# Patient Record
Sex: Female | Born: 1975 | Race: White | Hispanic: No | Marital: Married | Smoking: Former smoker
Health system: Southern US, Community
[De-identification: ages and names within clinical notes are randomized; demographics above are authoritative.]

## PROBLEM LIST (undated history)

## (undated) DIAGNOSIS — Z8742 Personal history of other diseases of the female genital tract: Secondary | ICD-10-CM

## (undated) DIAGNOSIS — B379 Candidiasis, unspecified: Secondary | ICD-10-CM

## (undated) DIAGNOSIS — R102 Pelvic and perineal pain: Secondary | ICD-10-CM

## (undated) DIAGNOSIS — Z8619 Personal history of other infectious and parasitic diseases: Secondary | ICD-10-CM

## (undated) DIAGNOSIS — N923 Ovulation bleeding: Secondary | ICD-10-CM

## (undated) DIAGNOSIS — J189 Pneumonia, unspecified organism: Secondary | ICD-10-CM

## (undated) DIAGNOSIS — Z87448 Personal history of other diseases of urinary system: Secondary | ICD-10-CM

## (undated) DIAGNOSIS — IMO0002 Reserved for concepts with insufficient information to code with codable children: Secondary | ICD-10-CM

## (undated) DIAGNOSIS — N809 Endometriosis, unspecified: Secondary | ICD-10-CM

## (undated) DIAGNOSIS — G8929 Other chronic pain: Secondary | ICD-10-CM

## (undated) HISTORY — DX: Personal history of other diseases of urinary system: Z87.448

## (undated) HISTORY — DX: Personal history of other diseases of the female genital tract: Z87.42

## (undated) HISTORY — DX: Pneumonia, unspecified organism: J18.9

## (undated) HISTORY — DX: Ovulation bleeding: N92.3

## (undated) HISTORY — PX: DENTAL SURGERY: SHX609

## (undated) HISTORY — DX: Other chronic pain: G89.29

## (undated) HISTORY — DX: Reserved for concepts with insufficient information to code with codable children: IMO0002

## (undated) HISTORY — DX: Candidiasis, unspecified: B37.9

## (undated) HISTORY — DX: Endometriosis, unspecified: N80.9

## (undated) HISTORY — DX: Pelvic and perineal pain: R10.2

## (undated) HISTORY — DX: Personal history of other infectious and parasitic diseases: Z86.19

---

## 1998-08-19 ENCOUNTER — Emergency Department (HOSPITAL_COMMUNITY): Admission: EM | Admit: 1998-08-19 | Discharge: 1998-08-19 | Payer: Self-pay | Admitting: Emergency Medicine

## 2000-05-13 ENCOUNTER — Other Ambulatory Visit: Admission: RE | Admit: 2000-05-13 | Discharge: 2000-05-13 | Payer: Self-pay | Admitting: Family Medicine

## 2000-07-03 ENCOUNTER — Ambulatory Visit (HOSPITAL_COMMUNITY): Admission: RE | Admit: 2000-07-03 | Discharge: 2000-07-03 | Payer: Self-pay | Admitting: Family Medicine

## 2000-07-03 ENCOUNTER — Encounter: Payer: Self-pay | Admitting: Family Medicine

## 2000-11-05 ENCOUNTER — Inpatient Hospital Stay (HOSPITAL_COMMUNITY): Admission: AD | Admit: 2000-11-05 | Discharge: 2000-11-08 | Payer: Self-pay | Admitting: Family Medicine

## 2003-02-22 ENCOUNTER — Other Ambulatory Visit: Admission: RE | Admit: 2003-02-22 | Discharge: 2003-02-22 | Payer: Self-pay | Admitting: Obstetrics and Gynecology

## 2003-07-29 ENCOUNTER — Inpatient Hospital Stay (HOSPITAL_COMMUNITY): Admission: AD | Admit: 2003-07-29 | Discharge: 2003-07-29 | Payer: Self-pay | Admitting: Obstetrics and Gynecology

## 2003-08-03 ENCOUNTER — Inpatient Hospital Stay (HOSPITAL_COMMUNITY): Admission: AD | Admit: 2003-08-03 | Discharge: 2003-08-05 | Payer: Self-pay | Admitting: Obstetrics and Gynecology

## 2003-09-12 ENCOUNTER — Encounter (INDEPENDENT_AMBULATORY_CARE_PROVIDER_SITE_OTHER): Payer: Self-pay | Admitting: Specialist

## 2003-09-12 ENCOUNTER — Ambulatory Visit (HOSPITAL_COMMUNITY): Admission: RE | Admit: 2003-09-12 | Discharge: 2003-09-12 | Payer: Self-pay | Admitting: Obstetrics and Gynecology

## 2004-01-22 DIAGNOSIS — Z8742 Personal history of other diseases of the female genital tract: Secondary | ICD-10-CM

## 2004-01-22 HISTORY — DX: Personal history of other diseases of the female genital tract: Z87.42

## 2004-02-22 ENCOUNTER — Other Ambulatory Visit: Admission: RE | Admit: 2004-02-22 | Discharge: 2004-02-22 | Payer: Self-pay | Admitting: Obstetrics and Gynecology

## 2004-03-08 ENCOUNTER — Other Ambulatory Visit: Admission: RE | Admit: 2004-03-08 | Discharge: 2004-03-08 | Payer: Self-pay | Admitting: Obstetrics and Gynecology

## 2004-03-23 DIAGNOSIS — IMO0002 Reserved for concepts with insufficient information to code with codable children: Secondary | ICD-10-CM

## 2004-03-23 HISTORY — DX: Reserved for concepts with insufficient information to code with codable children: IMO0002

## 2004-04-12 ENCOUNTER — Other Ambulatory Visit: Admission: RE | Admit: 2004-04-12 | Discharge: 2004-04-12 | Payer: Self-pay | Admitting: Obstetrics and Gynecology

## 2005-01-21 DIAGNOSIS — IMO0002 Reserved for concepts with insufficient information to code with codable children: Secondary | ICD-10-CM

## 2005-01-21 DIAGNOSIS — N923 Ovulation bleeding: Secondary | ICD-10-CM

## 2005-01-21 HISTORY — DX: Ovulation bleeding: N92.3

## 2005-01-21 HISTORY — DX: Reserved for concepts with insufficient information to code with codable children: IMO0002

## 2005-04-01 ENCOUNTER — Other Ambulatory Visit: Admission: RE | Admit: 2005-04-01 | Discharge: 2005-04-01 | Payer: Self-pay | Admitting: Obstetrics and Gynecology

## 2006-01-21 DIAGNOSIS — Z8742 Personal history of other diseases of the female genital tract: Secondary | ICD-10-CM

## 2006-01-21 DIAGNOSIS — N809 Endometriosis, unspecified: Secondary | ICD-10-CM

## 2006-01-21 HISTORY — DX: Endometriosis, unspecified: N80.9

## 2006-01-21 HISTORY — DX: Personal history of other diseases of the female genital tract: Z87.42

## 2006-05-16 ENCOUNTER — Ambulatory Visit (HOSPITAL_COMMUNITY): Admission: RE | Admit: 2006-05-16 | Discharge: 2006-05-17 | Payer: Self-pay | Admitting: Obstetrics and Gynecology

## 2006-05-16 ENCOUNTER — Encounter (INDEPENDENT_AMBULATORY_CARE_PROVIDER_SITE_OTHER): Payer: Self-pay | Admitting: Specialist

## 2006-05-16 DIAGNOSIS — G8929 Other chronic pain: Secondary | ICD-10-CM

## 2006-05-16 HISTORY — PX: ABDOMINAL HYSTERECTOMY: SHX81

## 2006-05-16 HISTORY — DX: Other chronic pain: G89.29

## 2007-03-24 ENCOUNTER — Encounter (INDEPENDENT_AMBULATORY_CARE_PROVIDER_SITE_OTHER): Payer: Self-pay | Admitting: General Surgery

## 2007-03-24 ENCOUNTER — Observation Stay (HOSPITAL_COMMUNITY): Admission: EM | Admit: 2007-03-24 | Discharge: 2007-03-25 | Payer: Self-pay | Admitting: Emergency Medicine

## 2009-04-10 ENCOUNTER — Encounter: Admission: RE | Admit: 2009-04-10 | Discharge: 2009-04-10 | Payer: Self-pay | Admitting: Family Medicine

## 2009-07-13 IMAGING — CR DG CHEST 2V
2 series · 2 of 2 positions shown · non-contrast
Comparison: none

HISTORY: Chest pain, abdominal pain, vomiting

CHEST 2 VIEWS:
No prior study for comparison.
Normal heart size, mediastinal contours, and vascularity.
Mild bronchitic changes.
No infiltrate, effusion, or pneumothorax.
Bones unremarkable.

[w chest pa]
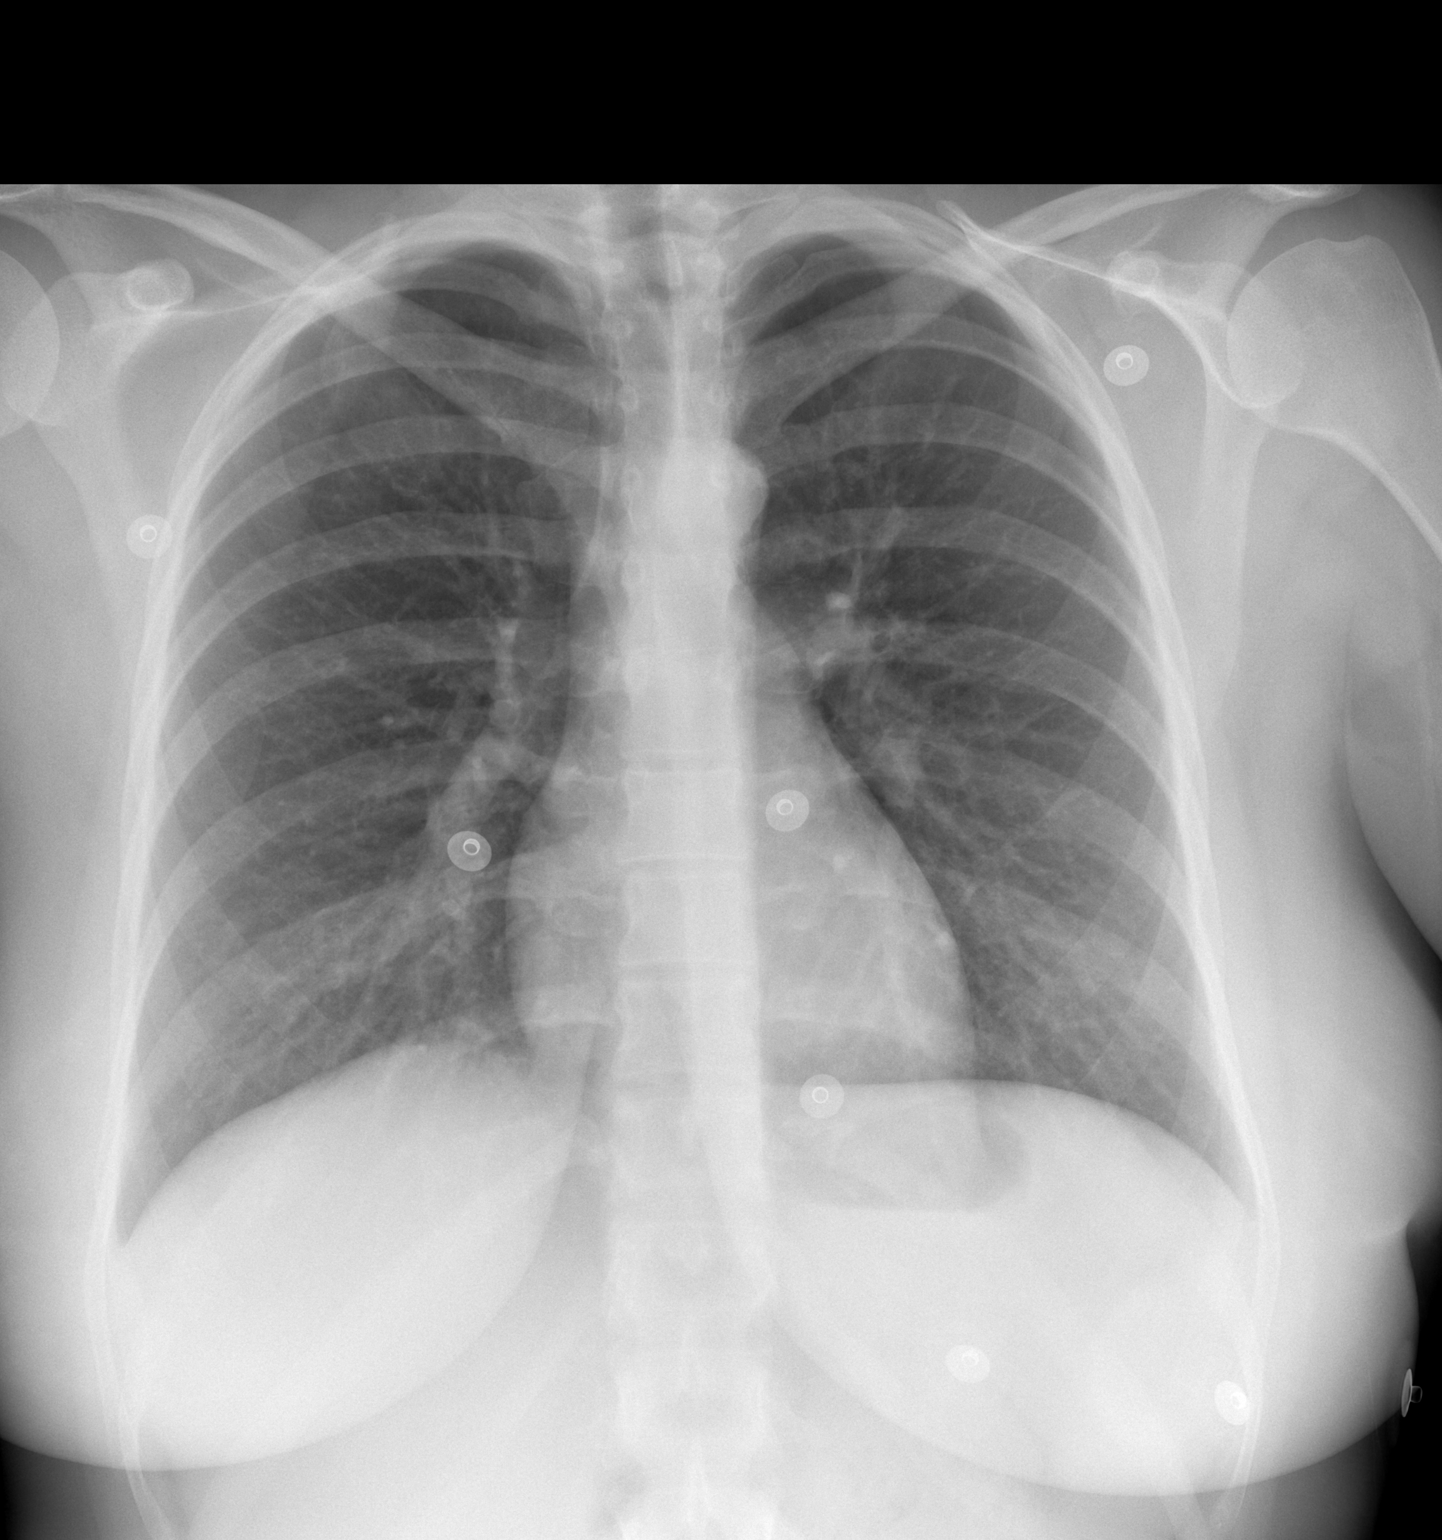

[w chest lat]
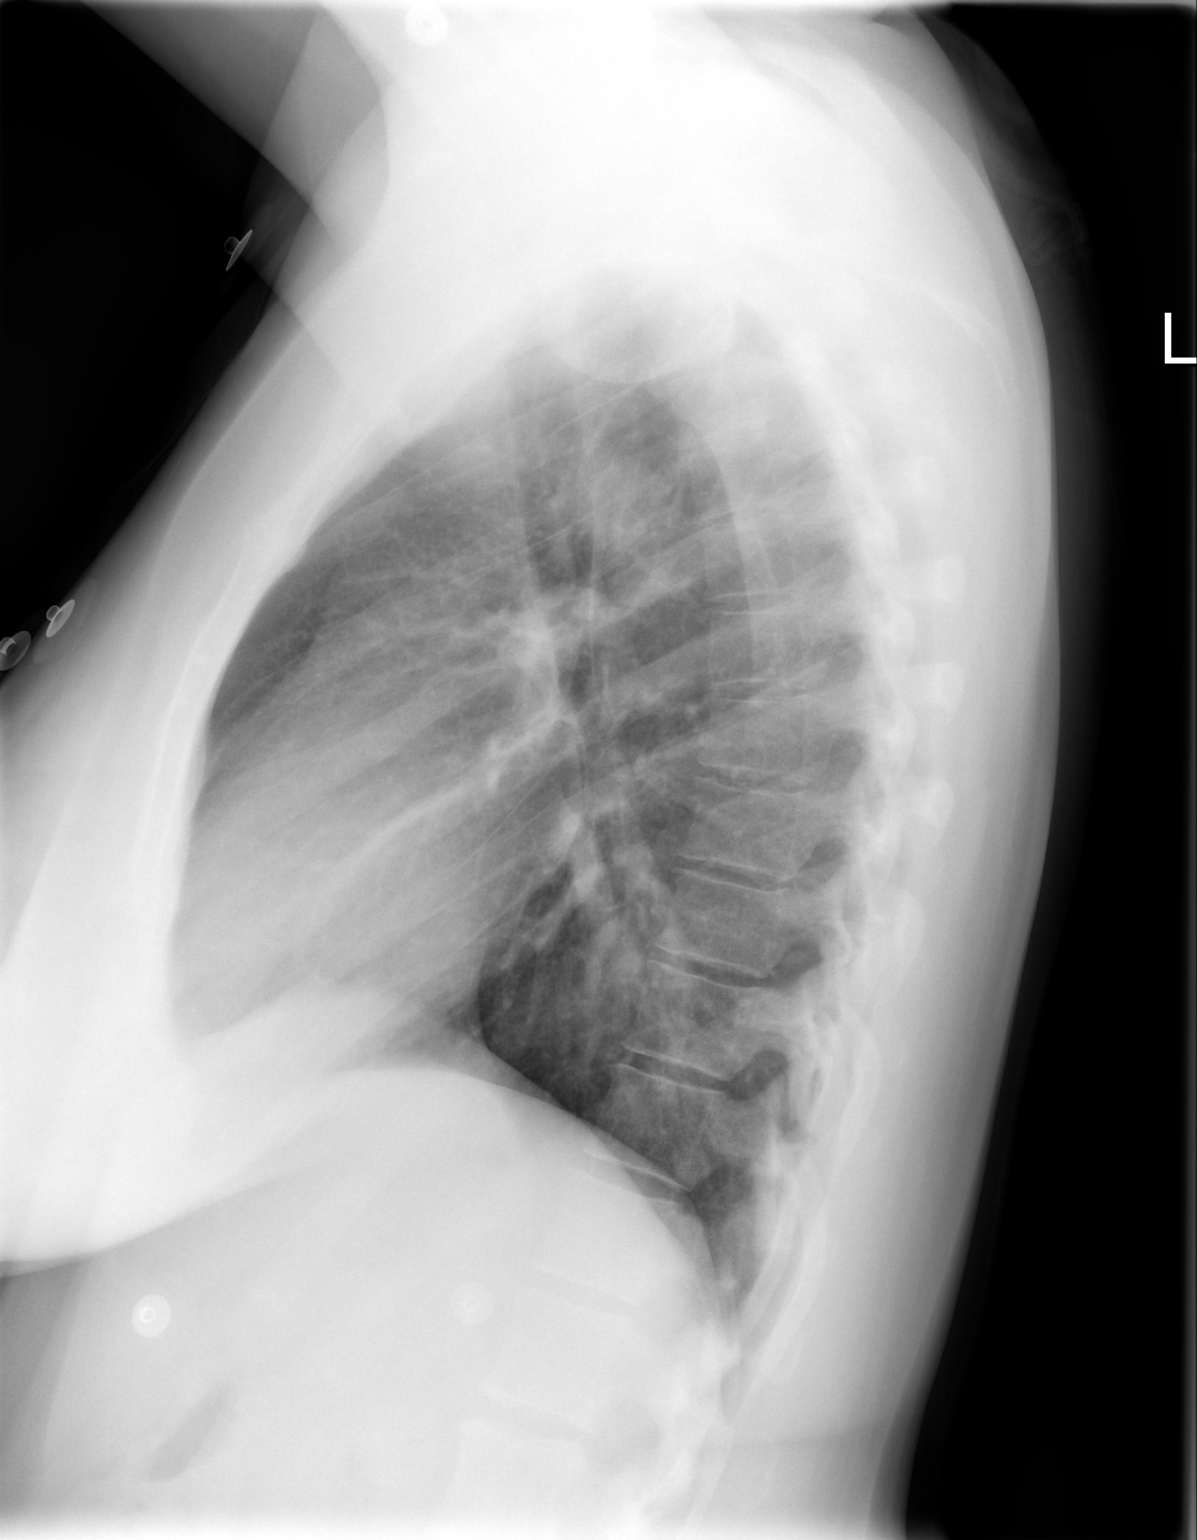

[2 of 2 positions shown; findings below may reference images not displayed]

IMPRESSION: Mild bronchitic changes.

## 2010-01-21 DIAGNOSIS — Z87448 Personal history of other diseases of urinary system: Secondary | ICD-10-CM

## 2010-01-21 HISTORY — DX: Personal history of other diseases of urinary system: Z87.448

## 2010-06-05 NOTE — Op Note (Signed)
NAMETIFFANNIE, SLOSS            ACCOUNT NO.:  [id number]   MEDICAL RECORD NO.:  [id number]          PATIENT TYPE:  INP   LOCATION:  5502                         FACILITY:  [facility]   PHYSICIAN:  Gabrielle Dare. Janee Morn, M.D.DATE OF BIRTH:  [birth year]   DATE OF PROCEDURE:  03/24/2007  DATE OF DISCHARGE:                               OPERATIVE REPORT   PREOPERATIVE DIAGNOSIS:  Acute cholecystitis.   POSTOPERATIVE DIAGNOSIS:  Acute cholecystitis.   PROCEDURE:  Laparoscopic cholecystectomy.   SURGEON:  Gabrielle Dare. Janee Morn, M.D.   ASSISTANT:  Sharlet Salina T. Hoxworth, M.D.   HISTORY OF PRESENT ILLNESS:  [name] is a 35 year old female who  presented to the emergency department with acute cholecystitis earlier  this morning.  She was admitted.  She was placed on IV antibiotics and  she was brought to the operating room for urgent cholecystectomy.   PROCEDURE IN DETAIL:  Informed consent was obtained.  The patient was  identified in the preop holding area.  She received intravenous  antibiotics.  She was brought to the operating room and general  anesthesia was administered by the anesthesia staff.  Her abdomen was  prepped and draped in a sterile fashion.  The infraumbilical region was  infiltrated with 0.25% Marcaine with epinephrine.  The infraumbilical  incision was made along a previous scar.  Subcutaneous tissues were  dissected down within the anterior fascia.  This was divided sharply  down the midline carefully.  Then the peritoneal cavity was entered  under direct vision without difficulty.  A 0 Vicryl pursestring suture  was placed around the fascial opening.  A Hasson trocar was inserted  into the abdomen, and the abdomen was insufflated with carbon dioxide in  a standard fashion.  Under direct vision, an 11 mm epigastric and two 5  mm lateral ports were placed.  0.25% Marcaine was used at all port  sites.  The gallbladder was very inflamed and distended.  It was drained  with  the Nahzat drain system decompressing it well with brown bile.  The  dome of the gallbladder was then retracted superiorly and medially.  There was a large amount of omental adhesions to the body of the  gallbladder.  These were gradually swept down using careful blunt  dissection and selective bovie cautery to get excellent hemostasis.  This continued revealing the infundibulum, and the infundibulum was  retracted inferolaterally.  Further dissection cleared away all the  filmy remaining adhesions.  Dissection began laterally and progressed  medially.  First we identified a small anterior cystic artery.  This was  clipped twice proximally and divided distally with cautery.  Dissection  continued laterally and progressed medially identifying the cystic duct  as well as the cystic artery main branch.  Dissection continued until a  large window was created between the infundibulum of the gallbladder,  cystic duct and the liver.  There were two large stones in the  infundibulum of the gallbladder.  These were milked into the body of the  gallbladder to facilitate the dissection.  Once we achieved excellent  visualization of the critical view of the cystic  duct, a clip was placed  on the infundibulocystic duct junction.  A small nick was made in the  cystic duct.  Reddick cholangiogram catheter was then attempted to be  inserted, but we were unable to pass it into the cystic duct due to some  valve that was there.  Despite multiple attempts, we were unable to do  that.  The patient's liver function tests had been within normal limits,  and anatomy was good.  We abandoned the cholangiogram.  Three clips were  then placed proximally in the cystic duct under direct vision achieving  excellent closure.  Cystic duct was divided.  Further dissection  revealed the main branch of the cystic artery.  This was clipped twice  proximally, once distally and divided.  The gallbladder was taken off  the  liver bed with the Bovie cautery achieving excellent hemostasis.  Along the way gallbladder was placed in an EndoCatch bag and removed  from the abdomen via the infraumbilical port site.  The abdomen was  copiously irrigated, and the liver bed was rechecked and remained  completely dry with the clips in excellent position.  Irrigation fluid  was evacuated, and it was clipped.  After the irrigation fluid was  evacuated, the liver bed was rechecked and remained dry.  Ports were  removed under direct vision.  The pneumoperitoneum was released.  The  Hasson trocar was removed.  The infraumbilical fascia was closed by  tying a 0 Vicryl pursestring suture with care not to trap any intra-  abdominal contents.  All four wounds were copiously irrigated.  The  infraumbilical and gastric wounds were closed with running 4-0 Vicryl in  subcuticular stitch.  All four wounds were then closed with Dermabond.  Sponge, needle, and instrument counts were all correct.  The patient  tolerated the procedure well without apparent complications and was  taken to the recovery room in stable condition.      Gabrielle Dare Janee Morn, M.D.  Electronically Signed     BET/MEDQ  D:  03/24/2007  T:  03/24/2007  Job:  [id number]
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
## 2010-06-08 NOTE — Op Note (Signed)
Anita Jenkins, CARONE                        ACCOUNT NO.:  [id number]   MEDICAL RECORD NO.:  [id number]                   PATIENT TYPE:  AMB   LOCATION:  SDC                                  FACILITY:  [facility]   PHYSICIAN:  Hal Morales, M.D.             DATE OF BIRTH:  [birth year]   DATE OF PROCEDURE:  09/12/2003  DATE OF DISCHARGE:                                 OPERATIVE REPORT   PREOPERATIVE DIAGNOSIS:  Desire for surgical sterilization.   POSTOPERATIVE DIAGNOSIS:  Desire for surgical sterilization.   OPERATION PERFORMED:  Laparoscopic tubal cautery, peritoneal biopsies.   SURGEON:  Hal Morales, M.D.   ANESTHESIA:  General orotracheal anesthesia.   ESTIMATED BLOOD LOSS:  Less than 25 mL.   COMPLICATIONS:  None.   FINDINGS:  The uterus, tubes and ovaries were within normal limits.  In the  anterior peritoneum, there was a peritoneal window in the right posterior  cul-de-sac there was a hypopigmented bleb that could be consistent with  endometriosis.   DESCRIPTION OF PROCEDURE:  The patient was taken to the operating room after  appropriate identification and placed on the operating table.  After the  attainment of adequate general anesthesia, she was placed in the modified  lithotomy position.  The abdomen, perineum and vagina were prepped with  multiple layers of Betadine and a red Robinson catheter used to empty the  bladder.  The single toothed tenaculum was placed on the anterior cervix and  the abdomen and perineum draped as a sterile field.  The subumbilical and  suprapubic regions were injected with a total of 10 mL of 0.25% Marcaine.  A  subumbilical incision was made and a Veress cannula placed through that  incision into the peritoneal cavity.  A pneumoperitoneum was created with 5  L of CO2.  The Veress cannula was removed and the laparoscopic trocar placed  through that incision into the peritoneal cavity.  The laparoscope was  placed through the  trocar sleeve.  The above-noted findings were made and  documented.  The right fallopian tube was identified, followed to its  fimbriated end, then grasped at the isthmic portion and cauterized into  adjacent areas.  A similar procedure was carried out on the opposite site  with the left fallopian tube.  Biopsies were obtained from the right  anterior peritoneum at the site of the peritoneal window and the right  posterior cul-de-sac peritoneum at the site of the hypopigmented  bleb  because of the patient's complaint of occasional right lower quadrant pain.  These biopsies were sent for pathologic evaluation.  All areas remained  hemostatic.  The instruments were removed from the peritoneal cavity under  direct visualization as the CO2 was allowed to escape.  The subumbilical  incision was closed first with a fascial stitch of 0 Vicryl, then a  subcuticular stitch of 3-0 Vicryl.  Sterile dressings were applied and a  single  toothed tenaculum removed.  The patient was awakened from general  anesthesia and taken to the  recovery room in satisfactory condition having tolerated the procedure well  with sponge and instrument counts correct.   SPECIMENS:  Biopsy of anterior peritoneum.  Biopsy of posterior peritoneum.                                               Hal Morales, M.D.    VPH/MEDQ  D:  09/12/2003  T:  09/12/2003  Job:  [id number]
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
## 2010-06-08 NOTE — Op Note (Signed)
Anita Jenkins, Anita Jenkins            ACCOUNT NO.:  [id number]   MEDICAL RECORD NO.:  [id number]          PATIENT TYPE:  OIB   LOCATION:  1539                         FACILITY:  [facility]   PHYSICIAN:  Crist Fat. Rivard, M.D. DATE OF BIRTH:  07/24/1975   DATE OF PROCEDURE:  05/16/2006  DATE OF DISCHARGE:                               OPERATIVE REPORT   PREOPERATIVE DIAGNOSIS:  Chronic pelvic pain with metrorrhagia and  endometriosis.   POSTOPERATIVE DIAGNOSIS:  Chronic pelvic pain with metrorrhagia and  endometriosis.   ANESTHESIA:  General.   PROCEDURE:  Robotic-assisted hysterectomy.   SURGEON:  Crist Fat. Rivard, MD.   ASSISTANT:  Elmira J. Lowell Guitar, Surveyor, mining.   ESTIMATED BLOOD LOSS:  75 ml.   PROCEDURE:  After being informed of the planned procedure with possible  complications, including bleeding, infection, injury to bowels, bladder,  or ureters, need for laparotomy as well as the possibility pain will not  be completely resolved due to endometriosis, informed consent is  obtained, patient is taken to OR #10, given general anesthesia with  endotracheal intubation without any complication.  She was placed in a  lithotomy position on a sticky mattress with sequential compression  devices knee-high on her legs.  Her arms are padded and tucked to her  sides, and her chest is held to the operating table.  She is prepped and  draped in a sterile fashion.  Her GYN exam reveals an anteverted uterus  normal in size and shape, two normal adnexa.  A weighted speculum is  inserted in the vagina.  The anterior lip of the cervix is grasped with  a tenaculum forceps, and the uterus is sounded at 7 cm.  The cervix is  easily dilated with Shawnie Pons dilators to a #25, which allows easy entry of  a #6 RUMI intrauterine manipulator.  Weighted speculum is removed,  tenaculum is removed, and a Foley catheter is inserted in her bladder.   The umbilical area is infiltrated with Marcaine 0.25,  and we perform a  semi-elliptical incision, which is brought down sharply to the fascia.  The fascia is identified, grasped with Kocher forceps, incised with Mayo  scissors, and peritoneum is entered bluntly.  A pursestring suture of #0  Vicryl is placed on the fascia, and a Hassan trocar 12 mm is easily  inserted and held in place with the pursestring suture.  We can now  insufflate a pneumoperitoneum using CO2 at a maximum pressure of 15  mmHg.  The laparoscope is inserted.  Observation:  Anterior cul-de-sac  is normal, posterior cul-de-sac is normal, uterus is normal in size and  shape, two normal adnexa, two normal tubes, no active lesion of  endometriosis is noted at this time, but please note that the patient  just completed a course of Depo-Lupron 11.25 mg.   We then evaluate for trocar sites, and we insert under direct  visualization after infiltration with 3 ml of Marcaine 0.25 for each  side, three 8 mm robotic trocars, two on the left, one on the right, and  we also insert a 10 mm patient side assistant trocar  on the right.The [employer] robot is then easily docked to the trocars.   We are now ready for console time.  Using a Gyrus PK forceps as well as  monopolar scissors, we proceed with right side, we cauterize tube and  section it, cauterize tubo-ovarian ligament and section it, cauterize  round ligament and section it.  This allows [place] a sharp entry into the  broad ligament anteriorly and posteriorly.  Anteriorly, we proceed with  sharp and blunt dissection of the bladder, which at this time is felt to  be adherent to the uterus.  The broad ligament anteriorly is also noted  to be thickened, which could possibly be remnants of endometriosis.  We  proceed with this dissection step by step until the bladder is  completely pushed away from the vaginal anterior cul-de-sac identified  with the [facility] ring.  We then skeletonized the right uterine artery and we  are able to  cauterize it in its ascending branch using PK energy and we  can section it.  We then move our attention to the left side and proceed  in the exact same fashion by cauterizing and sectioning in order the  tube, tubo-ovarian ligament, and round ligament.  Again, the broad  ligament is entered sharply anteriorly and posteriorly, and we finish  bladder dissection on the left corner of the vaginal angle.  The left  uterine artery is then skeletonized completely and cauterized with PK  forceps and sectioned.  Of course, before sectioning and cauterizing  both uterine arteries, the ureter is under direct visualization and away  from our site of incision.  We can now open the vagina on the  Koh ring  using monopolar scissors in a circumferential manner and detach the  uterus completely, which is delivered via the vagina.  Instruments have  been modified for two suture holder, and we closed the vagina in figure-  of-eight stitches of #0 Vicryl.  We then irrigate profusely with warm  saline and note a satisfactory hemostasis as well as two normal ureters.  A half-sheet of Intercede is deposited on the vaginal vault.   All trocars have been removed under direct visualization after undocking  the robot and after evacuating the pneumoperitoneum.  The fascia of the  umbilical incision is closed with the previously placed pursestring  suture of #0 Vicryl, the fascia of the 10 mm right-sided trocar is  closed with a figure-of-eight stitch of #0 Vicryl, all incisions are  closed with subcuticular suture of #3-0 Monocryl and Steri-Strips.   Instrument and sponge count is complete x2, estimated blood loss is 75  ml, the procedure was very well tolerated by the patient, who is taken  to recovery room in a well and stable condition.  Total operating time  was 3 hours with total console time of 55 minutes.      Crist Fat Rivard, M.D.  Electronically Signed    SAR/MEDQ  D:  05/17/2006  T:  05/17/2006   Job:  [id number]
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
## 2010-10-15 LAB — POCT I-STAT CREATININE
Creatinine, Ser: 0.8
Operator id: 277751

## 2010-10-15 LAB — DIFFERENTIAL
Basophils Absolute: 0.5 — ABNORMAL HIGH
Eosinophils Relative: 0
Lymphocytes Relative: 11 — ABNORMAL LOW
Lymphs Abs: 1.7
Monocytes Absolute: 0.6
Neutro Abs: 12.6 — ABNORMAL HIGH

## 2010-10-15 LAB — URINALYSIS, ROUTINE W REFLEX MICROSCOPIC
Bilirubin Urine: NEGATIVE
Hgb urine dipstick: NEGATIVE
Protein, ur: NEGATIVE
Urobilinogen, UA: 0.2

## 2010-10-15 LAB — POCT CARDIAC MARKERS
Operator id: 277751
Troponin i, poc: <0.05

## 2010-10-15 LAB — HEPATIC FUNCTION PANEL
Alkaline Phosphatase: 62
Bilirubin, Direct: 0.3
Indirect Bilirubin: 0.4
Total Bilirubin: 0.7

## 2010-10-15 LAB — I-STAT 8, (EC8 V) (CONVERTED LAB)
BUN: 10
Chloride: 106
HCT: 41
Hemoglobin: 13.9
Operator id: 277751
Potassium: 3.4 — ABNORMAL LOW
pCO2, Ven: 27 — ABNORMAL LOW
pH, Ven: 7.516 — ABNORMAL HIGH

## 2010-10-15 LAB — CBC
HCT: 38.4
Hemoglobin: 13.4
WBC: 15.4 — ABNORMAL HIGH

## 2010-10-15 LAB — POCT PREGNANCY, URINE: Preg Test, Ur: NEGATIVE

## 2010-10-15 LAB — LIPASE, BLOOD: Lipase: 17

## 2011-08-27 ENCOUNTER — Ambulatory Visit (INDEPENDENT_AMBULATORY_CARE_PROVIDER_SITE_OTHER): Payer: 59 | Admitting: Obstetrics and Gynecology

## 2011-08-27 ENCOUNTER — Encounter: Payer: Self-pay | Admitting: Obstetrics and Gynecology

## 2011-08-27 VITALS — BP 110/60 | Temp 98.5°F | Resp 14 | Ht 61.0 in | Wt 174.0 lb

## 2011-08-27 DIAGNOSIS — N809 Endometriosis, unspecified: Secondary | ICD-10-CM

## 2011-08-27 DIAGNOSIS — R109 Unspecified abdominal pain: Secondary | ICD-10-CM

## 2011-08-27 DIAGNOSIS — N949 Unspecified condition associated with female genital organs and menstrual cycle: Secondary | ICD-10-CM

## 2011-08-27 DIAGNOSIS — R102 Pelvic and perineal pain: Secondary | ICD-10-CM | POA: Insufficient documentation

## 2011-08-27 LAB — POCT URINALYSIS DIPSTICK
Blood, UA: NEGATIVE
Nitrite, UA: NEGATIVE
Urobilinogen, UA: NEGATIVE
pH, UA: 8

## 2011-08-27 MED ORDER — IBUPROFEN 800 MG PO TABS: 800.0000 mg | ORAL_TABLET | Freq: Three times a day (TID) | ORAL | Status: AC | PRN

## 2011-08-27 MED ORDER — OXYCODONE-ACETAMINOPHEN 10-325 MG PO TABS: 1.0000 | ORAL_TABLET | Freq: Four times a day (QID) | ORAL | Status: AC | PRN

## 2011-08-27 NOTE — Progress Notes (Signed)
Vag. Discharge:no Odor:no Fever:no Irreg.Periods:no Dyspareunia:yes Dysuria:no Frequency:no Urgency:no Hematuria:no Kidney stones:no Constipation:no Diarrhea:no Rectal Bleeding: no Vomiting:no Nausea:no Pregnant:no Fibroids:no Endometriosis:yes Hx of Ovarian Cyst:no Hx IUD:no Hx STD-PID:no Appendectomy:no Gall Bladder Dz:yes  HISTORY OF PRESENT ILLNESS  [name] is a 36 y.o. year old female,G2P2002, who presents for a problem visit. The patient complains of increasing pelvic pain.  She rates her pain as 7/10.  She has a history of endometriosis.  She complains of deep dyspareunia.  She has some constipation.  She has a history of hemorrhoids.  She has rectal bleeding and frequently.  She denies urinary tract symptoms.  She had a robotic hysterectomy in 2008.  Subjective:  The patient reports that her pain is increasing in intensity.  She reports that her libido has decreased because of her pain and discomfort.  Objective:  BP 110/60  Temp 98.5 F (36.9 C) (Oral)  Resp 14  Ht 5\' 1"  (1.549 m)  Wt 174 lb (78.926 kg)  BMI 32.88 kg/m2   General: alert, cooperative and no distress Resp: clear to auscultation bilaterally Cardio: regular rate and rhythm, S1, S2 normal, no murmur, click, rub or gallop GI: soft and nontender  External genitalia: normal general appearance Vaginal: normal without tenderness, induration or masses Cervix: absent Adnexa: no masses appreciated, tender on the right Uterus: absent  UA: negative  Assessment:  Increasing pelvic pain History of endometriosis Constipation History of pelvic pain Dyspareunia  Plan:  Management of pelvic pain was discussed with the patient and her husband.  Lupron therapy was suggested with the thought that it may relieve her discomfort.  The risk and benefits of Lupron were outlined.  Laparoscopy was discussed.  I am concerned about additional surgery because of the potential for adhesion  formation.  The patient and her husband want to consider these options.  They will return in 2 weeks to speak with Dr. Estanislado Pandy.  Percocet and Motrin as prescribed.  Return to office prn if symptoms worsen or fail to improve.  Leonard Schwartz M.D.  08/27/2011 6:33 PM

## 2011-09-10 ENCOUNTER — Encounter: Payer: 59 | Admitting: Obstetrics and Gynecology

## 2011-09-10 ENCOUNTER — Ambulatory Visit (INDEPENDENT_AMBULATORY_CARE_PROVIDER_SITE_OTHER): Payer: 59 | Admitting: Obstetrics and Gynecology

## 2011-09-10 ENCOUNTER — Encounter: Payer: Self-pay | Admitting: Obstetrics and Gynecology

## 2011-09-10 VITALS — BP 110/74 | Wt 173.0 lb

## 2011-09-10 DIAGNOSIS — N809 Endometriosis, unspecified: Secondary | ICD-10-CM

## 2011-09-10 DIAGNOSIS — G8929 Other chronic pain: Secondary | ICD-10-CM

## 2011-09-10 DIAGNOSIS — N949 Unspecified condition associated with female genital organs and menstrual cycle: Secondary | ICD-10-CM

## 2011-09-10 NOTE — Progress Notes (Signed)
   Subjective:    Anita Jenkins is a 36 y.o. female, [id number], who presents for  2 week f/u. To discuss Lupron therapy per AVS.CPP worsening in the last few months to a year.  Saw AVS 08/27/11:  "Anita Jenkins is a 36 y.o. year old female,G2P2002, who presents for a problem visit. The patient complains of increasing pelvic pain. She rates her pain as 7/10. She has a history of endometriosis. She complains of deep dyspareunia. She has some constipation. She has a history of hemorrhoids. She has rectal bleeding and frequently. She denies urinary tract symptoms. She had a robotic hysterectomy in 2008"  The following portions of the patient's history were reviewed and updated as appropriate: allergies, current medications, past family history.  Review of Systems Pertinent items are noted in HPI.   Objective:    BP 110/74  Wt 173 lb (78.472 kg)    Weight:  Wt Readings from Last 1 Encounters:  09/10/11 173 lb (78.472 kg)          BMI: There is no height on file to calculate BMI.  General Appearance: Alert, appropriate appearance for age. No acute distress GYN exam: deferred   Assessment:    CPP with endometriosis    Plan:    Treatment options reviewed: Depo-Lupron with add-back, continuous BCP or BSO with R&B. Pt and husband desire trial of Depo-Lupron     [facility]

## 2011-09-10 NOTE — Patient Instructions (Signed)
Depo-Lupron for endometriosis booklet

## 2011-09-13 ENCOUNTER — Telehealth: Payer: Self-pay | Admitting: Obstetrics and Gynecology

## 2011-09-13 MED ORDER — LEUPROLIDE ACETATE (3 MONTH) 11.25 MG IM KIT: 11.2500 mg | PACK | INTRAMUSCULAR | Status: AC

## 2011-09-13 NOTE — Telephone Encounter (Signed)
TC to [place] at [facility] [phone]. No prior authorization needed. Depot Lupron order called to Irving Burton, pharmacist.  Pt will be notified in 30 business days regarding coverage and copay prior to shipping of Rx.  Fax to be sent to office with info.  Phone call lasted 25+ min.

## 2011-09-18 ENCOUNTER — Telehealth: Payer: Self-pay | Admitting: Obstetrics and Gynecology

## 2011-09-18 NOTE — Telephone Encounter (Signed)
Triage/general quest. 

## 2011-09-18 NOTE — Telephone Encounter (Signed)
TC to pt. LM to return call.  

## 2011-09-19 NOTE — Telephone Encounter (Signed)
Pt rtnd call, states she does not want to get the Lupron [place] as it is too expensive, would like to consider the other options discussed w/ SR.  Pt also states has had rectal bldg, had discussed w/ AVS and pt is certain bldg is coming rectally.  Pt has not used any medications for relief.  Pt denies being constipated.  Advised pt to consult w/ PCP or try using Preparation-H, Tucks pads, or Witch hazel to the area for relief.  Pt voices agreement.  Also informed will give msg about other options to SR assistant and will have her call back.

## 2011-09-24 ENCOUNTER — Telehealth: Payer: Self-pay | Admitting: Obstetrics and Gynecology

## 2011-09-24 NOTE — Telephone Encounter (Signed)
Returned pt's husband phone call. (No ROI in chart.) States pt had difficulty returning calls at work. Informed unable to discuss info, but pt has already returned my call and spoken with someone (KP note in chart.) To call with further questions.

## 2011-09-25 ENCOUNTER — Telehealth: Payer: Self-pay

## 2011-09-25 NOTE — Telephone Encounter (Signed)
PC from pt. Pt decided that she cannot afford Lupron at this time with insurance change.  Would like to try continuous BCP.  Will consult SR as to which pill and then notify pt.  ld

## 2011-09-30 NOTE — Telephone Encounter (Signed)
OK to call in Loestrin 1/20 to use continuous. Follow-up 3 months or at [facility] whichever comes first

## 2011-10-01 MED ORDER — NORETHINDRONE ACET-ETHINYL EST 1-20 MG-MCG PO TABS: 1.0000 | ORAL_TABLET | Freq: Every day | ORAL | Status: AC

## 2011-10-01 NOTE — Telephone Encounter (Signed)
Ok for Loestrin 1/20 continuous per SR. F/u in 3 months or [facility] whichever comes first.  LM for pt to call with questions.  Will go ahead and sen rx to pharmacy.  ld

## 2011-10-28 ENCOUNTER — Telehealth: Payer: Self-pay | Admitting: Obstetrics and Gynecology

## 2011-10-28 NOTE — Telephone Encounter (Signed)
LM FOR PT TO CALL BACK. 

## 2011-12-04 ENCOUNTER — Encounter: Payer: Self-pay | Admitting: Obstetrics and Gynecology

## 2011-12-04 ENCOUNTER — Ambulatory Visit (INDEPENDENT_AMBULATORY_CARE_PROVIDER_SITE_OTHER): Payer: 59 | Admitting: Obstetrics and Gynecology

## 2011-12-04 VITALS — BP 122/80 | Ht 61.0 in | Wt 173.0 lb

## 2011-12-04 DIAGNOSIS — N809 Endometriosis, unspecified: Secondary | ICD-10-CM

## 2011-12-04 MED ORDER — MEDROXYPROGESTERONE ACETATE 150 MG/ML IM SUSP: 150.0000 mg | INTRAMUSCULAR | Status: AC

## 2011-12-04 NOTE — Progress Notes (Signed)
Subjective:    Anita Jenkins is a 36 y.o. female, [id number], who presents for follow up on Loestrin.Pt states still have pain on intercourse. Known for endometriosis. Robotic hysterectomy 2008. Was scheduled for Depo-Lupron: Insurance had poor coverage due to high deductible.   Dyspareunia with every deep penetration despite BCP. Occasional cramping that is mild and non bothersome.    The following portions of the patient's history were reviewed and updated as appropriate: allergies, current medications, past family history.  Review of Systems Pertinent items are noted in HPI. Gastrointestinal: Negative for abdominal pain, change in bowel habits or rectal bleeding Urinary:negative   Objective:    BP 122/80  Ht 5\' 1"  (1.549 m)  Wt 173 lb (78.472 kg)  BMI 32.69 kg/m2    Weight:  Wt Readings from Last 1 Encounters:  12/04/11 173 lb (78.472 kg)          BMI: Body mass index is 32.69 kg/(m^2).  General Appearance: Alert, appropriate appearance for age. No acute distress GYN exam: deferred   Assessment:    Dyspareunia 2nd to endometriosis    Plan:    Options reviewed: Depo-Provera, conservative surgery, definitive surgery preferably after trial of Depo-Lupron with risks or early menopause Pt would like to try depo-Provera AEX December  Silverio Lay MD

## 2011-12-18 ENCOUNTER — Other Ambulatory Visit: Payer: 59

## 2012-01-13 ENCOUNTER — Ambulatory Visit (INDEPENDENT_AMBULATORY_CARE_PROVIDER_SITE_OTHER): Payer: 59 | Admitting: Obstetrics and Gynecology

## 2012-01-13 ENCOUNTER — Encounter: Payer: Self-pay | Admitting: Obstetrics and Gynecology

## 2012-01-13 VITALS — BP 106/70 | Resp 14 | Ht 61.0 in | Wt 175.0 lb

## 2012-01-13 DIAGNOSIS — N7011 Chronic salpingitis: Secondary | ICD-10-CM

## 2012-01-13 DIAGNOSIS — Z0000 Encounter for general adult medical examination without abnormal findings: Secondary | ICD-10-CM

## 2012-01-13 DIAGNOSIS — N7013 Chronic salpingitis and oophoritis: Secondary | ICD-10-CM

## 2012-01-13 NOTE — Progress Notes (Signed)
Patient ID: Anita Jenkins, female   DOB: 10-07-1975, 36 y.o.   MRN: [id number] Last Pap: 12/2010 WNL: Yes Regular Periods:no Contraception: hyst  Monthly Breast exam:no Tetanus<32yrs:no Nl.Bladder Function:yes Daily BMs:yes Healthy Diet:yes Calcium:no Mammogram:no Date of Mammogram: N/A Exercise:no Have often Exercise: irreg/ not often Seatbelt: yes Abuse at home: no Stressful work:no Sigmoid-colonoscopy: N/A Bone Density: No PCP: [facility] Change in PMH: none Change in QIO:NGEX BP 106/70  Resp 14  Ht 5\' 1"  (1.549 m)  Wt 175 lb (79.379 kg)  BMI 33.07 kg/m2 Pt with complaints:no Physical Examination: General appearance - alert, well appearing, and in no distress Mental status - normal mood, behavior, speech, dress, motor activity, and thought processes Neck - supple, no significant adenopathy,  thyroid exam: thyroid is normal in size without nodules or tenderness Chest - clear to auscultation, no wheezes, rales or rhonchi, symmetric air entry Heart - normal rate and regular rhythm Abdomen - soft, nontender, nondistended, no masses or organomegaly Breasts - breasts appear normal, no suspicious masses, no skin or nipple changes or axillary nodes Pelvic - normal external genitalia, vulva, vagina,  and adnexa.  Uterus and cervix surgically absent Rectal - rectal exam not indicated Back exam - full range of motion, no tenderness, palpable spasm or pain on motion Neurological - alert, oriented, normal speech, no focal findings or movement disorder noted Musculoskeletal - no joint tenderness, deformity or swelling Extremities - no edema, redness or tenderness in the calves or thighs Skin - normal coloration and turgor, no rashes, no suspicious skin lesions noted Routine exam Pap sent no.  Last pap WNL.  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

## 2012-03-10 ENCOUNTER — Encounter: Payer: Self-pay | Admitting: Obstetrics and Gynecology

## 2012-03-10 ENCOUNTER — Other Ambulatory Visit: Payer: 59

## 2012-03-10 DIAGNOSIS — Z3009 Encounter for other general counseling and advice on contraception: Secondary | ICD-10-CM

## 2012-03-10 MED ORDER — MEDROXYPROGESTERONE ACETATE 150 MG/ML IM SUSP
150.0000 mg | Freq: Once | INTRAMUSCULAR | Status: AC
  Administered 2012-03-10: 150 mg via INTRAMUSCULAR

## 2012-03-10 NOTE — Progress Notes (Unsigned)
Next Depo due 06-01-2012 

## 2013-11-22 ENCOUNTER — Encounter: Payer: Self-pay | Admitting: Obstetrics and Gynecology

## 2015-03-10 ENCOUNTER — Emergency Department (HOSPITAL_BASED_OUTPATIENT_CLINIC_OR_DEPARTMENT_OTHER)
Admission: EM | Admit: 2015-03-10 | Discharge: 2015-03-11 | Disposition: A | Discharge status: Home / Self Care | Payer: BLUE CROSS/BLUE SHIELD | Attending: Emergency Medicine | Admitting: Emergency Medicine

## 2015-03-10 ENCOUNTER — Encounter (HOSPITAL_BASED_OUTPATIENT_CLINIC_OR_DEPARTMENT_OTHER): Payer: Self-pay | Admitting: *Deleted

## 2015-03-10 DIAGNOSIS — Y998 Other external cause status: Secondary | ICD-10-CM | POA: Insufficient documentation

## 2015-03-10 DIAGNOSIS — Z8619 Personal history of other infectious and parasitic diseases: Secondary | ICD-10-CM | POA: Diagnosis not present

## 2015-03-10 DIAGNOSIS — Z8742 Personal history of other diseases of the female genital tract: Secondary | ICD-10-CM | POA: Diagnosis not present

## 2015-03-10 DIAGNOSIS — Y9389 Activity, other specified: Secondary | ICD-10-CM | POA: Insufficient documentation

## 2015-03-10 DIAGNOSIS — G8929 Other chronic pain: Secondary | ICD-10-CM | POA: Insufficient documentation

## 2015-03-10 DIAGNOSIS — Y9289 Other specified places as the place of occurrence of the external cause: Secondary | ICD-10-CM | POA: Insufficient documentation

## 2015-03-10 DIAGNOSIS — S6992XA Unspecified injury of left wrist, hand and finger(s), initial encounter: Secondary | ICD-10-CM | POA: Diagnosis present

## 2015-03-10 DIAGNOSIS — S61012A Laceration without foreign body of left thumb without damage to nail, initial encounter: Secondary | ICD-10-CM | POA: Insufficient documentation

## 2015-03-10 DIAGNOSIS — Z23 Encounter for immunization: Secondary | ICD-10-CM | POA: Insufficient documentation

## 2015-03-10 DIAGNOSIS — W260XXA Contact with knife, initial encounter: Secondary | ICD-10-CM | POA: Diagnosis not present

## 2015-03-10 DIAGNOSIS — Z8701 Personal history of pneumonia (recurrent): Secondary | ICD-10-CM | POA: Diagnosis not present

## 2015-03-10 DIAGNOSIS — Z793 Long term (current) use of hormonal contraceptives: Secondary | ICD-10-CM | POA: Insufficient documentation

## 2015-03-10 DIAGNOSIS — Z79899 Other long term (current) drug therapy: Secondary | ICD-10-CM | POA: Diagnosis not present

## 2015-03-10 DIAGNOSIS — Z87891 Personal history of nicotine dependence: Secondary | ICD-10-CM | POA: Diagnosis not present

## 2015-03-10 MED ORDER — TETANUS-DIPHTH-ACELL PERTUSSIS 5-2.5-18.5 LF-MCG/0.5 IM SUSP
0.5000 mL | Freq: Once | INTRAMUSCULAR | Status: AC
  Administered 2015-03-10: 0.5 mL via INTRAMUSCULAR
  Filled 2015-03-10: qty 0.5
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
## 2015-03-10 NOTE — ED Notes (Signed)
Patient is alert and oriented x4.  She is complaining of a left thumb laceration by a knife. Patient was doing an art project and slipped and cut herself.  Currently she rates her pain 7 of 10.

## 2015-03-11 MED ORDER — LIDOCAINE HCL (PF) 1 % IJ SOLN
5.0000 mL | Freq: Once | INTRAMUSCULAR | Status: DC
  Filled 2015-03-11: qty 5

## 2015-03-11 MED ORDER — LIDOCAINE HCL (PF) 1 % IJ SOLN
INTRAMUSCULAR | Status: AC
  Filled 2015-03-11: qty 5

## 2015-03-11 NOTE — Discharge Instructions (Signed)
Take tylenol or ibuprofen as needed for discomfort. Follow up in one week for suture removal. Return sooner for any signs of infection.

## 2015-03-11 NOTE — ED Notes (Signed)
Patient is alert and oriented x3.  She was given DC instructions and follow up visit instructions.  Patient gave verbal understanding. She was DC ambulatory under her own power to home.  V/S stable.  He was not showing any signs of distress on DC 

## 2018-05-03 ENCOUNTER — Emergency Department (HOSPITAL_COMMUNITY)
Admission: EM | Admit: 2018-05-03 | Discharge: 2018-05-03 | Disposition: A | Discharge status: Home / Self Care | Payer: BLUE CROSS/BLUE SHIELD | Attending: Emergency Medicine | Admitting: Emergency Medicine

## 2018-05-03 ENCOUNTER — Emergency Department (HOSPITAL_COMMUNITY): Payer: BLUE CROSS/BLUE SHIELD

## 2018-05-03 ENCOUNTER — Other Ambulatory Visit: Payer: Self-pay

## 2018-05-03 DIAGNOSIS — Z79899 Other long term (current) drug therapy: Secondary | ICD-10-CM | POA: Insufficient documentation

## 2018-05-03 DIAGNOSIS — R1031 Right lower quadrant pain: Secondary | ICD-10-CM | POA: Diagnosis not present

## 2018-05-03 DIAGNOSIS — Z87891 Personal history of nicotine dependence: Secondary | ICD-10-CM | POA: Diagnosis not present

## 2018-05-03 LAB — CBC WITH DIFFERENTIAL/PLATELET
Abs Immature Granulocytes: 0.02 10*3/uL (ref 0.00–0.07)
Basophils Absolute: 0 10*3/uL (ref 0.0–0.1)
Basophils Relative: 0 %
Eosinophils Absolute: 0.1 10*3/uL (ref 0.0–0.5)
Eosinophils Relative: 1 %
HCT: 39 % (ref 36.0–46.0)
Hemoglobin: 13.1 g/dL (ref 12.0–15.0)
Immature Granulocytes: 0 %
Lymphocytes Relative: 33 %
Lymphs Abs: 2.7 10*3/uL (ref 0.7–4.0)
MCH: 29 pg (ref 26.0–34.0)
MCHC: 33.6 g/dL (ref 30.0–36.0)
MCV: 86.5 fL (ref 80.0–100.0)
Monocytes Absolute: 0.6 10*3/uL (ref 0.1–1.0)
Monocytes Relative: 8 %
Neutro Abs: 4.8 10*3/uL (ref 1.7–7.7)
Neutrophils Relative %: 58 %
Platelets: 321 10*3/uL (ref 150–400)
RBC: 4.51 MIL/uL (ref 3.87–5.11)
RDW: 12.6 % (ref 11.5–15.5)
WBC: 8.3 10*3/uL (ref 4.0–10.5)
nRBC: 0 % (ref 0.0–0.2)

## 2018-05-03 LAB — COMPREHENSIVE METABOLIC PANEL
ALT: 13 U/L (ref 0–44)
AST: 12 U/L — ABNORMAL LOW (ref 15–41)
Albumin: 3.9 g/dL (ref 3.5–5.0)
Alkaline Phosphatase: 54 U/L (ref 38–126)
Anion gap: 7 (ref 5–15)
BUN: 12 mg/dL (ref 6–20)
CO2: 22 mmol/L (ref 22–32)
Calcium: 9.2 mg/dL (ref 8.9–10.3)
Chloride: 110 mmol/L (ref 98–111)
Creatinine, Ser: 0.82 mg/dL (ref 0.44–1.00)
GFR calc Af Amer: >60 mL/min (ref >60)
GFR calc non Af Amer: >60 mL/min (ref >60)
Glucose, Bld: 101 mg/dL — ABNORMAL HIGH (ref 70–99)
Potassium: 3.9 mmol/L (ref 3.5–5.1)
Sodium: 139 mmol/L (ref 135–145)
Total Bilirubin: 0.2 mg/dL — ABNORMAL LOW (ref 0.3–1.2)
Total Protein: 6.4 g/dL — ABNORMAL LOW (ref 6.5–8.1)

## 2018-05-03 LAB — URINALYSIS, ROUTINE W REFLEX MICROSCOPIC
Bilirubin Urine: NEGATIVE
Glucose, UA: NEGATIVE mg/dL
Hgb urine dipstick: NEGATIVE
Ketones, ur: NEGATIVE mg/dL
Leukocytes,Ua: NEGATIVE
Nitrite: NEGATIVE
Protein, ur: NEGATIVE mg/dL
Specific Gravity, Urine: 1.002 — ABNORMAL LOW (ref 1.005–1.030)
pH: 8 (ref 5.0–8.0)

## 2018-05-03 LAB — LIPASE, BLOOD: Lipase: 31 U/L (ref 11–51)

## 2018-05-03 LAB — POC URINE PREG, ED: Preg Test, Ur: NEGATIVE

## 2018-05-03 MED ORDER — IOHEXOL 300 MG/ML  SOLN
100.0000 mL | Freq: Once | INTRAMUSCULAR | Status: AC | PRN
  Administered 2018-05-03: 16:00:00 100 mL via INTRAVENOUS

## 2018-05-03 MED ORDER — KETOROLAC TROMETHAMINE 15 MG/ML IJ SOLN
15.0000 mg | Freq: Once | INTRAMUSCULAR | Status: AC
  Administered 2018-05-03: 15 mg via INTRAVENOUS
  Filled 2018-05-03: qty 1

## 2018-05-03 MED ORDER — DICYCLOMINE HCL 20 MG PO TABS: 20.0000 mg | ORAL_TABLET | Freq: Two times a day (BID) | ORAL | 0 refills | Status: AC | PRN

## 2018-05-03 MED ORDER — IBUPROFEN 800 MG PO TABS: 800.0000 mg | ORAL_TABLET | Freq: Three times a day (TID) | ORAL | 0 refills | Status: AC | PRN
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
## 2018-05-03 NOTE — Discharge Instructions (Addendum)
The cause of your pain was not identified today. Please follow up with your family doctor for recheck.  Get rechecked immediately if you develop new or concerning symptoms.    You had a CT scan of your abdomen performed today that showed some possible thickening in your rectum.  Please follow up with your family doctor for recheck.

## 2018-05-03 NOTE — ED Triage Notes (Signed)
Pt c/o lower abd pain ; denies any dysuria or vaginal discharge ; pt denies any n/v/d ; pt states she has endometriosis and states the pain feels about the same but states it is stronger ; pt states she hasn't taken anything to help relieve pain

## 2019-03-20 ENCOUNTER — Ambulatory Visit: Payer: Self-pay | Attending: Internal Medicine

## 2019-03-20 DIAGNOSIS — Z23 Encounter for immunization: Secondary | ICD-10-CM | POA: Insufficient documentation

## 2019-03-20 NOTE — Progress Notes (Signed)
   Covid-19 Vaccination Clinic  Name:  Anita Jenkins    MRN: [id number] DOB: [birth year]  03/20/2019  [name] was observed post Covid-19 immunization for 15 minutes without incidence. She was provided with Vaccine Information Sheet and instruction to access the V-Safe system.   [name] was instructed to call 911 with any severe reactions post vaccine: Marland Kitchen Difficulty breathing  . Swelling of your face and throat  . A fast heartbeat  . A bad rash all over your body  . Dizziness and weakness    Immunizations Administered    Name Date Dose VIS Date Route   Pfizer COVID-19 Vaccine 03/20/2019  9:01 AM 0.3 mL 01/01/2019 Intramuscular   Manufacturer: [employer], [employer]   Lot: [id number]   NDC: [address]-5

## 2019-04-10 ENCOUNTER — Ambulatory Visit: Payer: Self-pay | Attending: Internal Medicine

## 2019-04-10 DIAGNOSIS — Z23 Encounter for immunization: Secondary | ICD-10-CM

## 2019-04-10 NOTE — Progress Notes (Signed)
   Covid-19 Vaccination Clinic  Name:  Anita Jenkins    MRN: [id number] DOB: August 14, 1975  04/10/2019  [name] was observed post Covid-19 immunization for 15 minutes without incident. She was provided with Vaccine Information Sheet and instruction to access the V-Safe system.   [name] was instructed to call 911 with any severe reactions post vaccine: Marland Kitchen Difficulty breathing  . Swelling of face and throat  . A fast heartbeat  . A bad rash all over body  . Dizziness and weakness   Immunizations Administered    Name Date Dose VIS Date Route   Pfizer COVID-19 Vaccine 04/10/2019 10:21 AM 0.3 mL 01/01/2019 Intramuscular   Manufacturer: [employer], [employer]   Lot: [id number]   NDC: [address]-0

## 2019-04-14 ENCOUNTER — Ambulatory Visit: Payer: Self-pay

## 2020-08-22 IMAGING — CT CT ABDOMEN AND PELVIS WITH CONTRAST
2 of 5 series · 16 of 46 positions shown, 18 images · IV contrast (APPLIED)
Comparison: None.

CLINICAL DATA: Lower abdominal pain. History of endometriosis.

EXAM:
CT ABDOMEN AND PELVIS WITH CONTRAST
TECHNIQUE: Multidetector CT imaging of the abdomen and pelvis was performed
using the standard protocol following bolus administration of
intravenous contrast.
CONTRAST:  100mL OMNIPAQUE IOHEXOL 300 MG/ML  SOLN

[Series 3: abd/ pelvis 5.0 i30f 2 · axial · 0.82mm/px · z∈[+733,+1143]mm · 13 of 92 slices shown, 15 images]
[im 5/92  soft-tissue]
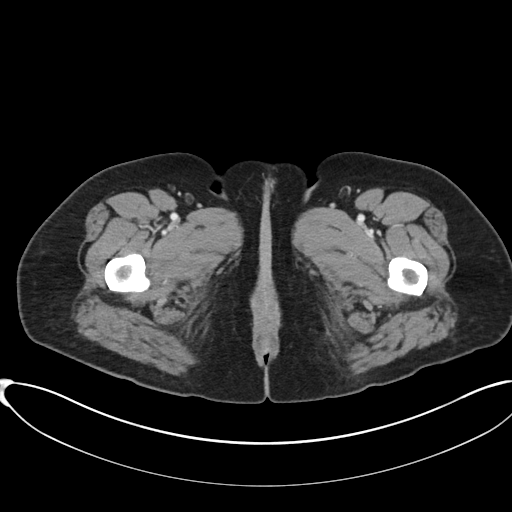
[im 5/92  bone]
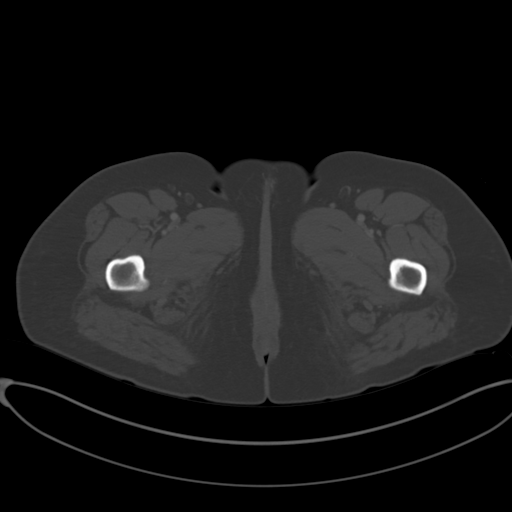
[im 15/92  soft-tissue]
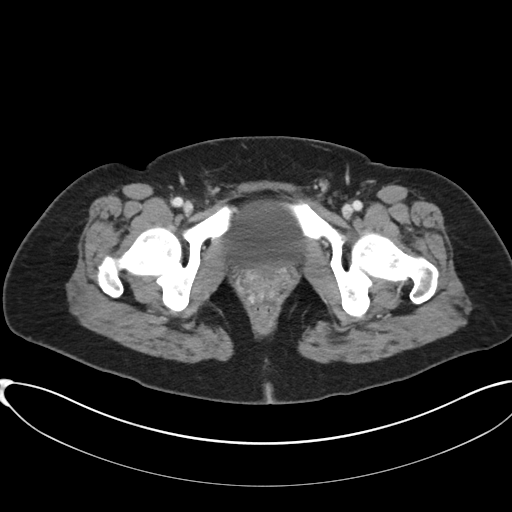
[im 20/92  soft-tissue]
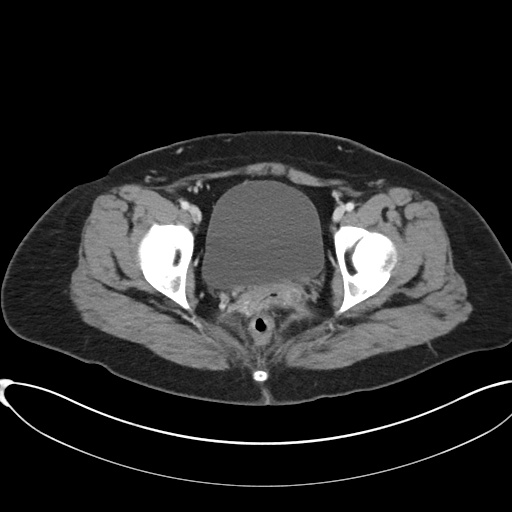
[im 24/92  soft-tissue]
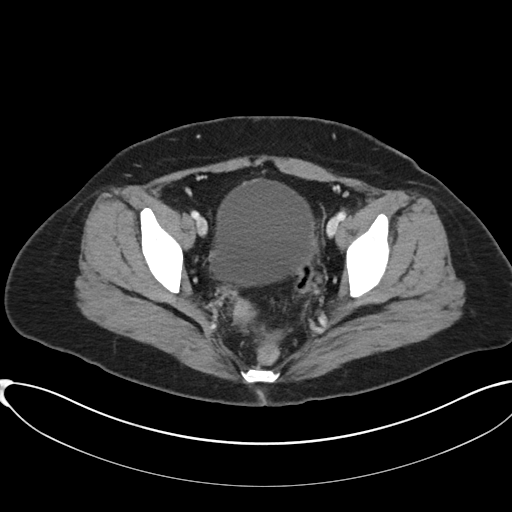
[im 34/92  soft-tissue]
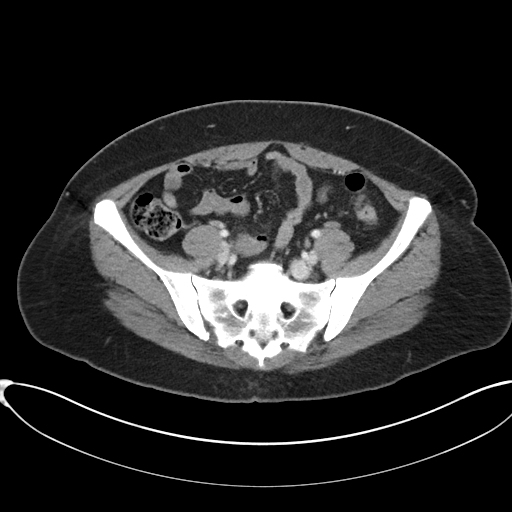
[im 39/92  soft-tissue]
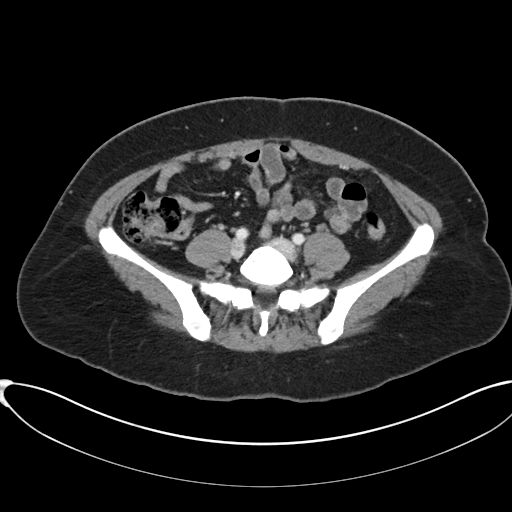
[im 48/92  soft-tissue]
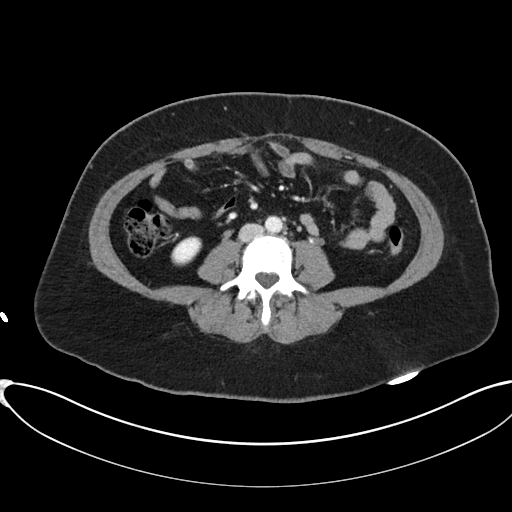
[im 53/92  soft-tissue]
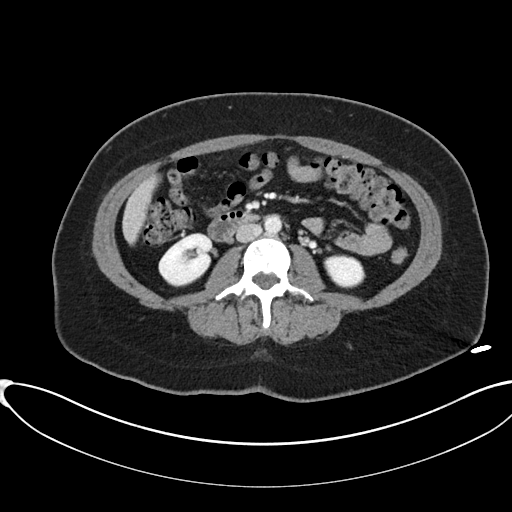
[im 58/92  soft-tissue]
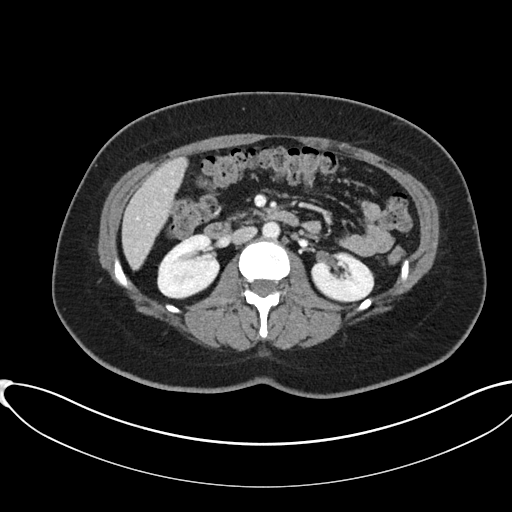
[im 58/92  bone]
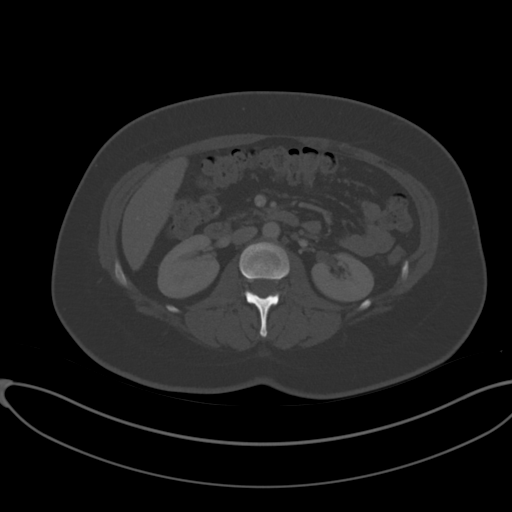
[im 68/92  soft-tissue]
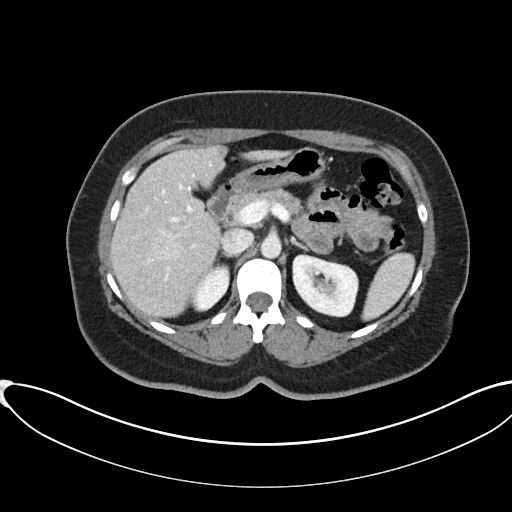
[im 72/92  soft-tissue]
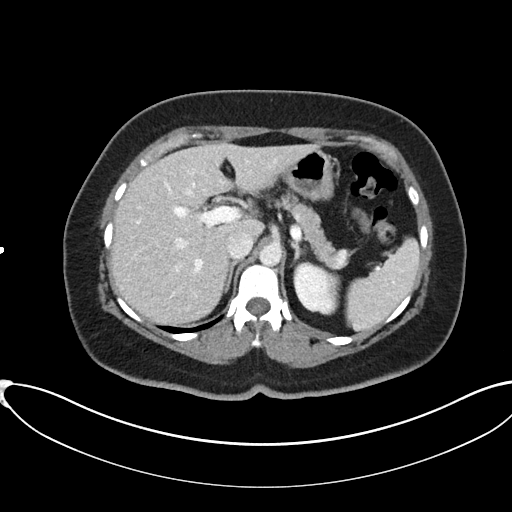
[im 77/92  soft-tissue]
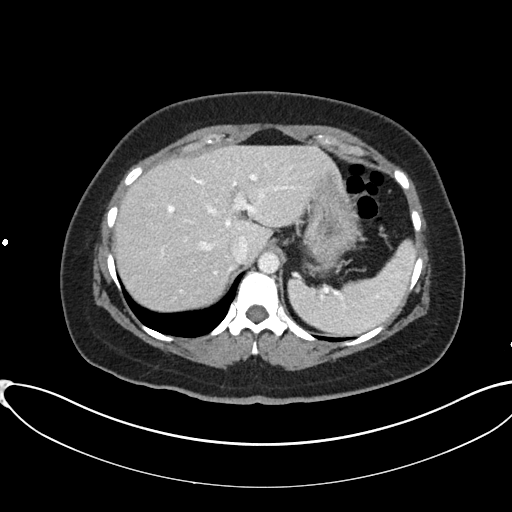
[im 87/92  soft-tissue]
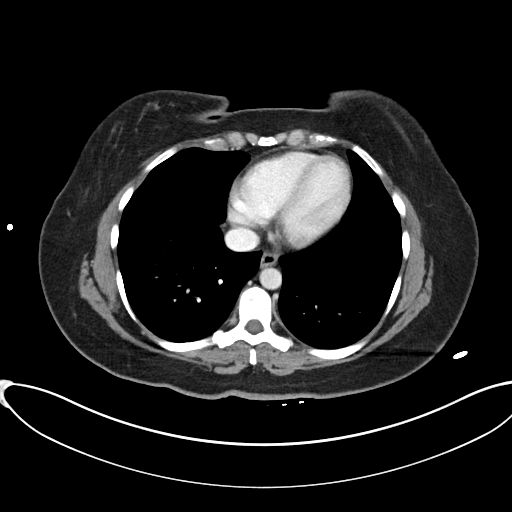

[Series 6: coronal soft tissue · coronal · 0.90mm/px · 3 of 99 slices shown]
[im 33/99  soft-tissue]
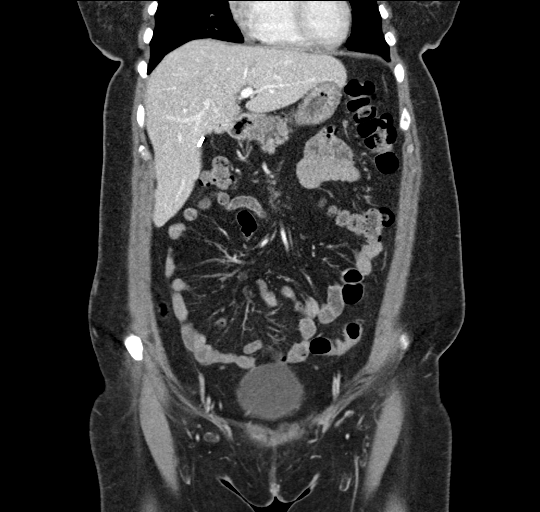
[im 44/99  soft-tissue]
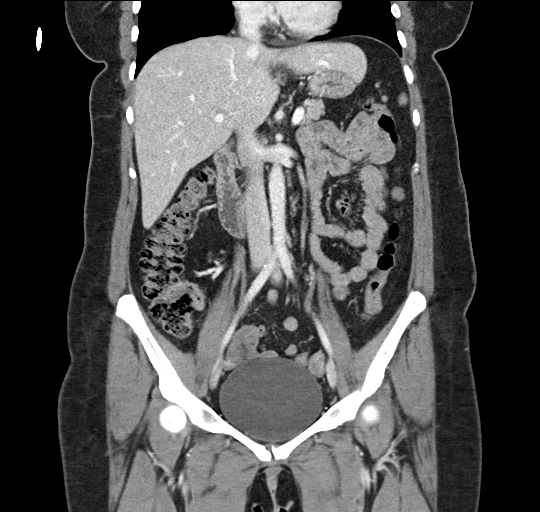
[im 55/99  soft-tissue]
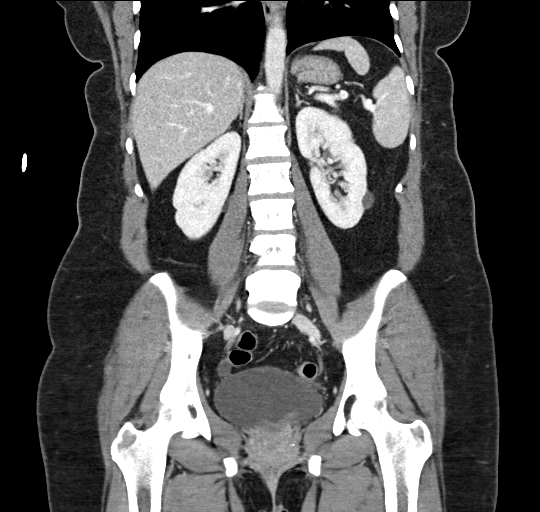

[16 of 46 positions shown; findings below may reference images not displayed]

FINDINGS: Lower chest: No acute abnormality.

Hepatobiliary: No focal liver abnormality is seen. Status post
cholecystectomy. No biliary dilatation.

Pancreas: Unremarkable. No pancreatic ductal dilatation or
surrounding inflammatory changes.

Spleen: Normal in size without focal abnormality.

Adrenals/Urinary Tract: Adrenal glands appear normal. Small LEFT
renal cyst. Kidneys otherwise unremarkable without suspicious mass,
stone or hydronephrosis. No perinephric fluid. No ureteral or
bladder calculi identified. Bladder appears normal.

Stomach/Bowel: No dilated large or small bowel loops. Questionable
thickening of the walls of the rectum. No other secondary signs of
bowel wall inflammation. Appendix is normal. Stomach is
unremarkable, partially decompressed.

Vascular/Lymphatic: No significant vascular findings are present. No
enlarged abdominal or pelvic lymph nodes.

Reproductive: Status post hysterectomy. No adnexal masses.
Heterogeneity at the cervical stump may be related to the given
history of endometriosis.

Other: Small amount of free fluid in the lower pelvis. No abscess
collection seen. No free intraperitoneal air.

Musculoskeletal: No acute or suspicious osseous finding.
IMPRESSION: 1. Questionable thickening of the walls of the rectum, but not
convincing, possibly accentuated by inadequate distention. Findings
could indicate proctitis of infectious or inflammatory nature.
2. Small amount of free fluid in the pelvis.
3. Remainder of the abdomen and pelvis CT is unremarkable, as
detailed above.
